# Patient Record
Sex: Male | Born: 2000 | Race: Black or African American | Hispanic: No | Marital: Single
Health system: Southern US, Community
[De-identification: ages and names within clinical notes are randomized; demographics above are authoritative.]

---

## 2018-12-05 ENCOUNTER — Encounter (HOSPITAL_COMMUNITY): Payer: Self-pay

## 2018-12-05 ENCOUNTER — Emergency Department (HOSPITAL_COMMUNITY): Payer: BLUE CROSS/BLUE SHIELD

## 2018-12-05 ENCOUNTER — Other Ambulatory Visit: Payer: Self-pay

## 2018-12-05 ENCOUNTER — Emergency Department (HOSPITAL_COMMUNITY)
Admission: EM | Admit: 2018-12-05 | Discharge: 2018-12-05 | Disposition: A | Discharge status: Home / Self Care | Payer: BLUE CROSS/BLUE SHIELD | Attending: Emergency Medicine | Admitting: Emergency Medicine

## 2018-12-05 DIAGNOSIS — S62356A Nondisplaced fracture of shaft of fifth metacarpal bone, right hand, initial encounter for closed fracture: Secondary | ICD-10-CM | POA: Diagnosis not present

## 2018-12-05 DIAGNOSIS — Y998 Other external cause status: Secondary | ICD-10-CM | POA: Insufficient documentation

## 2018-12-05 DIAGNOSIS — W231XXA Caught, crushed, jammed, or pinched between stationary objects, initial encounter: Secondary | ICD-10-CM | POA: Insufficient documentation

## 2018-12-05 DIAGNOSIS — S6991XA Unspecified injury of right wrist, hand and finger(s), initial encounter: Secondary | ICD-10-CM | POA: Diagnosis present

## 2018-12-05 DIAGNOSIS — Y9389 Activity, other specified: Secondary | ICD-10-CM | POA: Diagnosis not present

## 2018-12-05 DIAGNOSIS — Y92810 Car as the place of occurrence of the external cause: Secondary | ICD-10-CM | POA: Diagnosis not present

## 2018-12-05 MED ORDER — HYDROCODONE-ACETAMINOPHEN 5-325 MG PO TABS: 1.0000 | ORAL_TABLET | Freq: Four times a day (QID) | ORAL | 0 refills | Status: AC | PRN

## 2018-12-05 MED ORDER — OXYCODONE-ACETAMINOPHEN 5-325 MG PO TABS
1.0000 | ORAL_TABLET | Freq: Once | ORAL | Status: AC
  Administered 2018-12-05: 1 via ORAL
  Filled 2018-12-05: qty 1

## 2018-12-05 NOTE — ED Triage Notes (Signed)
Pt presents with c/o R hand swelling and pain (6/10), after accidentally closing hand in car door. Pt states that he was moving too fast.

## 2018-12-05 NOTE — ED Provider Notes (Signed)
[facility] EMERGENCY DEPARTMENT Provider Note   CSN: [id number] Arrival date & time: 12/05/18  1823     History   Chief Complaint Chief Complaint  Patient presents with  . Hand Injury    R    HPI Samuel Glover is a 18 y.o. male presenting for evaluation of right hand injury.  Patient states just prior to arrival, he accidentally slammed the car door on his right hand.  He reports acute onset pain.  He denies numbness or tingling.  He denies injury elsewhere.  He reports only be ulnar aspect of his hand got caught in a car door.  He is right-handed.  He has no medical problems, takes medications daily.  Pain is constant, worse with squeezing his fist.  He has not taken anything for pain including Tylenol ibuprofen.  HPI  History reviewed. No pertinent past medical history.  There are no active problems to display for this patient.    Home Medications    Prior to Admission medications   Not on File    Family History History reviewed. No pertinent family history.  Social History Social History   Tobacco Use  . Smoking status: Not on file  Substance Use Topics  . Alcohol use: Not on file  . Drug use: Not on file     Allergies   Patient has no known allergies.   Review of Systems Review of Systems  Musculoskeletal: Positive for arthralgias.  Neurological: Negative for numbness.     Physical Exam Updated Vital Signs BP (!) 146/101 (BP Location: Right Arm)   Pulse (!) 111   Temp 99.8 F (37.7 C)   Resp 18   Ht 5\' 9"  (1.753 m)   Wt 108.9 kg   SpO2 100%   BMI 35.44 kg/m   Physical Exam Vitals signs and nursing note reviewed.  Constitutional:      General: He is not in acute distress.    Appearance: He is well-developed.  HENT:     Head: Normocephalic and atraumatic.  Neck:     Musculoskeletal: Normal range of motion.  Pulmonary:     Effort: Pulmonary effort is normal.  Abdominal:     General: There is no distension.    Musculoskeletal:        General: Swelling and tenderness present.     Comments: Swelling and tenderness to the fifth metacarpal of the right hand.  No injury noted elsewhere in the hand.  No lacerations.  No tenderness palpation of the finger or wrist.  Full active range of motion of the wrist without pain.  Sensation of distal finger intact.  Good cap refill.  Skin:    General: Skin is warm.     Capillary Refill: Capillary refill takes less than 2 seconds.     Findings: No rash.  Neurological:     Mental Status: He is alert and oriented to person, place, and time.      ED Treatments / Results  Labs (all labs ordered are listed, but only abnormal results are displayed) Labs Reviewed - No data to display  EKG None  Radiology Dg Hand Complete Right  Result Date: 12/05/2018 CLINICAL DATA:  Shut car door on hand EXAM: RIGHT HAND - COMPLETE 3+ VIEW COMPARISON:  None. FINDINGS: Acute fracture midshaft right fifth metacarpal with mild volar angulation of distal fracture fragment. No subluxation. IMPRESSION: Acute mildly angulated fracture midshaft of the fifth metacarpal. Electronically Signed   By: Adrian Prows.D.  On: 12/05/2018 19:03    Procedures  .Splint Application Date/Time: 12/05/2018 8:58 PM Performed by: Milana Huntsman, NT Authorized by: Alveria Apley, PA-C   Consent:    Consent obtained:  Verbal   Consent given by:  Patient   Risks discussed:  Discoloration, numbness, pain and swelling Pre-procedure details:    Sensation:  Normal Procedure details:    Laterality:  Right   Location:  Hand   Hand:  R hand   Strapping: no     Cast type:  Short arm   Splint type:  Ulnar gutter Post-procedure details:    Pain:  Unchanged   Sensation:  Normal   Skin color:  Pink, warm, dry   Patient tolerance of procedure:  Tolerated well, no immediate complications   (including critical care time)  Medications Ordered in ED Medications  oxyCODONE-acetaminophen  (PERCOCET/ROXICET) 5-325 MG per tablet 1 tablet (1 tablet Oral Given 12/05/18 2050)     Initial Impression / Assessment and Plan / ED Course  I have reviewed the triage vital signs and the nursing notes.  Pertinent labs & imaging results that were available during my care of the patient were reviewed by me and considered in my medical decision making (see chart for details).     Presenting for evaluation of hand pain after getting caught in a car door.  Physical exam reassuring, he is neurovascularly intact.  X-rays read interpreted by me, shows a midshaft fracture with mild angulation.  Discussed findings with patient.  Will place an ulnar gutter splint, and have follow-up with hand as needed. Pt is tachycardic on arrival, likely 2/2 pain. Improved with pain control.   On reassessment, pt remains neurovascularly intact after splint placement. HR is mildly elevated, but patient also has low-grade fever at 99.8.  Patient denying uri sxs. Denies hand pain currently. Discussed symptomatic control at home with pain control, ice, and elevation. PMP checked, pt without recent controlled substance rx. Follow-up with hand.  At this time, patient appears safe for discharge.  Return precautions given.  Patient states he understands and agrees plan.  Final Clinical Impressions(s) / ED Diagnoses   Final diagnoses:  Closed nondisplaced fracture of shaft of fifth metacarpal bone of right hand, initial encounter    ED Discharge Orders         Ordered    HYDROcodone-acetaminophen (NORCO/VICODIN) 5-325 MG tablet  Every 6 hours PRN     12/05/18 2123           Alveria Apley, PA-C 12/05/18 2305    Vanetta Mulders, MD 12/07/18 1544

## 2018-12-05 NOTE — ED Notes (Signed)
Patient verbalizes understanding of discharge instructions. Opportunity for questioning and answers were provided. Armband removed by staff, pt discharged from ED.  

## 2018-12-05 NOTE — ED Notes (Signed)
Ortho tech at bedside 

## 2018-12-05 NOTE — ED Notes (Signed)
Ortho contacted for splint. 20 minute

## 2018-12-05 NOTE — Discharge Instructions (Signed)
Use Tylenol or ibuprofen as needed for mild to moderate pain.  Use Norco as needed for severe breakthrough pain.  This is a narcotic pain medicine, have caution to make you tired or groggy.  Do not drive or operate heavy machinery while taking this medicine. Keep your hand elevated when able to help decrease swelling. Call the hand doctor on Monday for further evaluation and management of your hand. Return to the emergency room if you develop numbness, severe worsening pain, or any new, worsening, concerning symptoms.

## 2018-12-05 NOTE — Progress Notes (Signed)
Orthopedic Tech Progress Note Patient Details:  Antonieta IbaJohn Hodgens 04/25/2001 [id number]  Ortho Devices Type of Ortho Device: Ulna gutter splint Ortho Device/Splint Interventions: Adjustment, Application, Ordered   Post Interventions Patient Tolerated: Well Instructions Provided: Adjustment of device, Care of device   Norva KarvonenWalls, Leon Goodnow T 12/05/2018, 9:09 PM

## 2020-09-07 IMAGING — CR DG HAND COMPLETE 3+V*R*
3 series · 3 of 3 positions shown · non-contrast
Comparison: None.

CLINICAL DATA: Shut car door on hand

EXAM:
RIGHT HAND - COMPLETE 3+ VIEW

[hand pa]
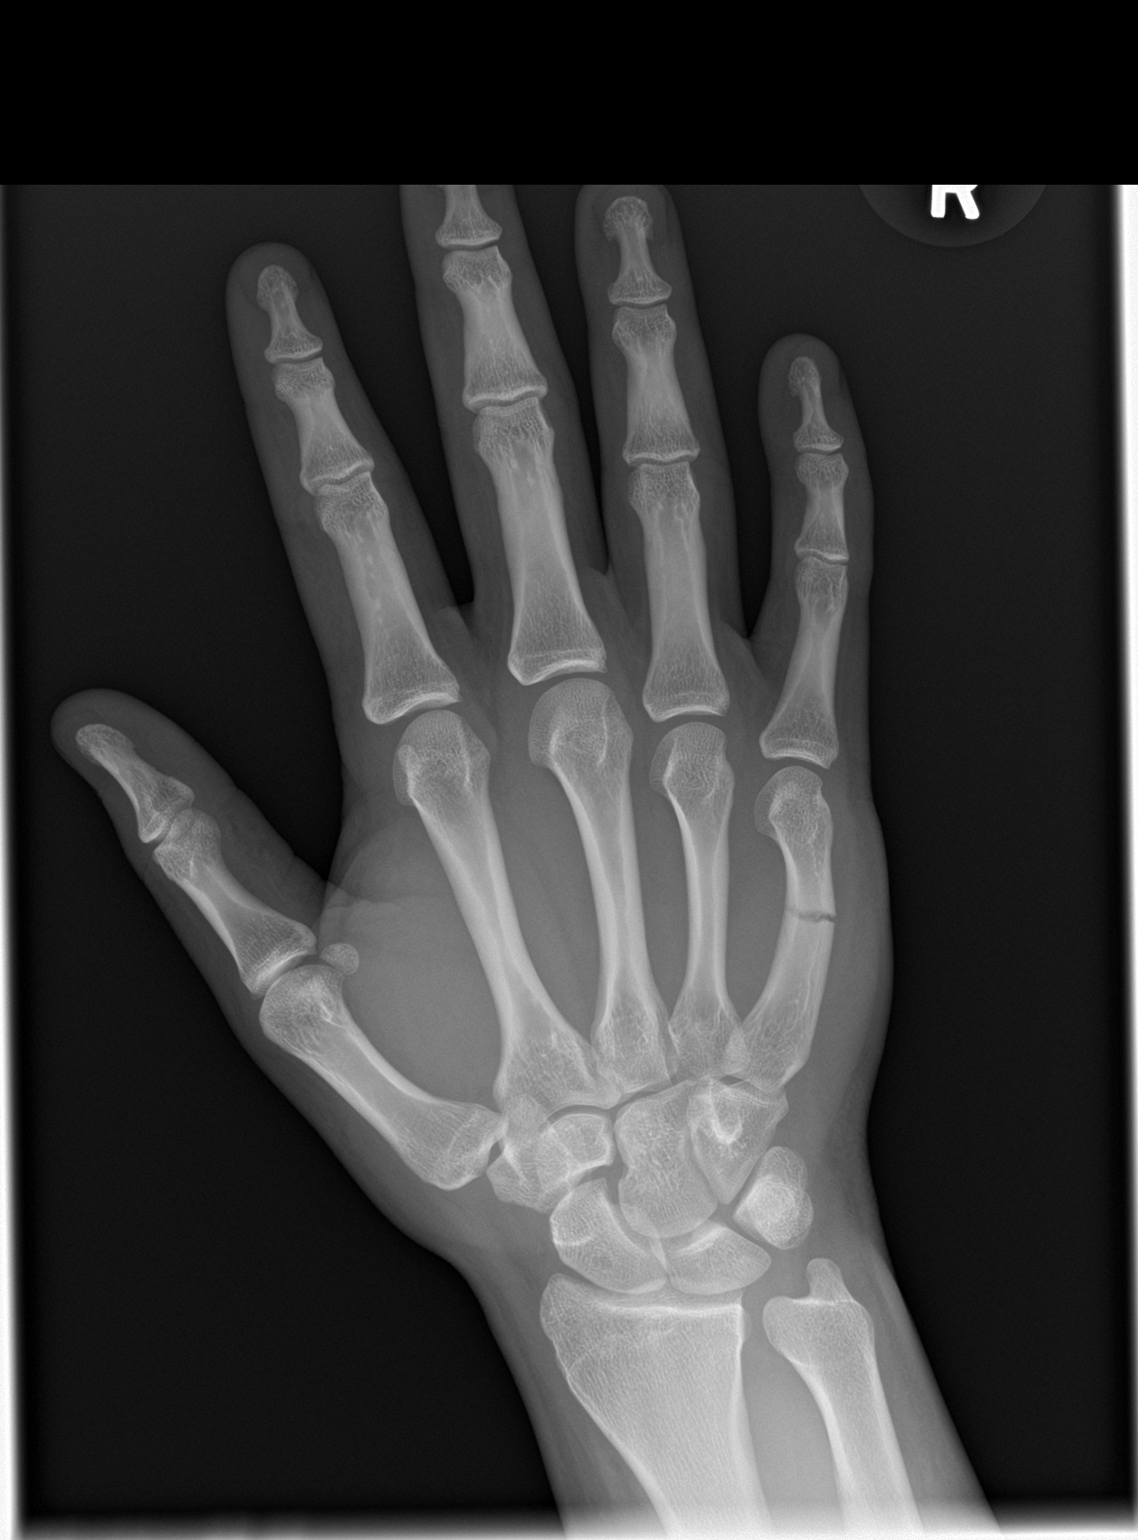

[hand obl]
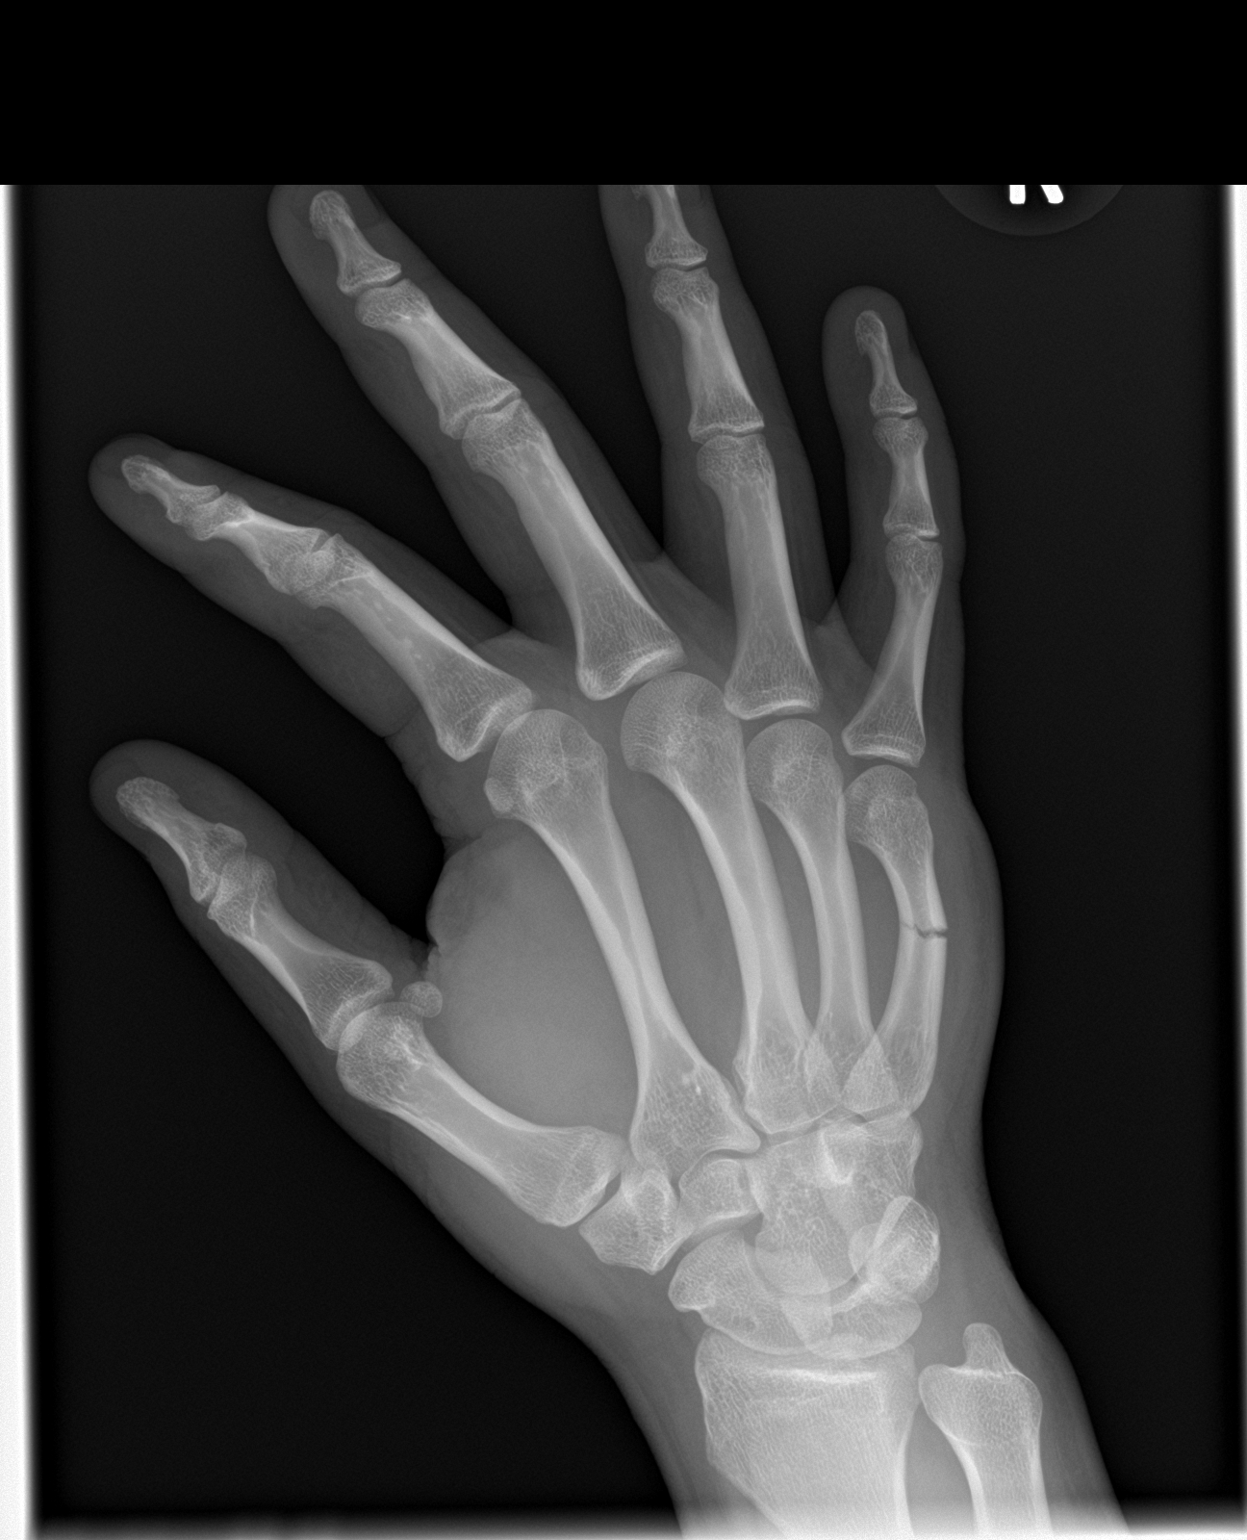

[hand lat]
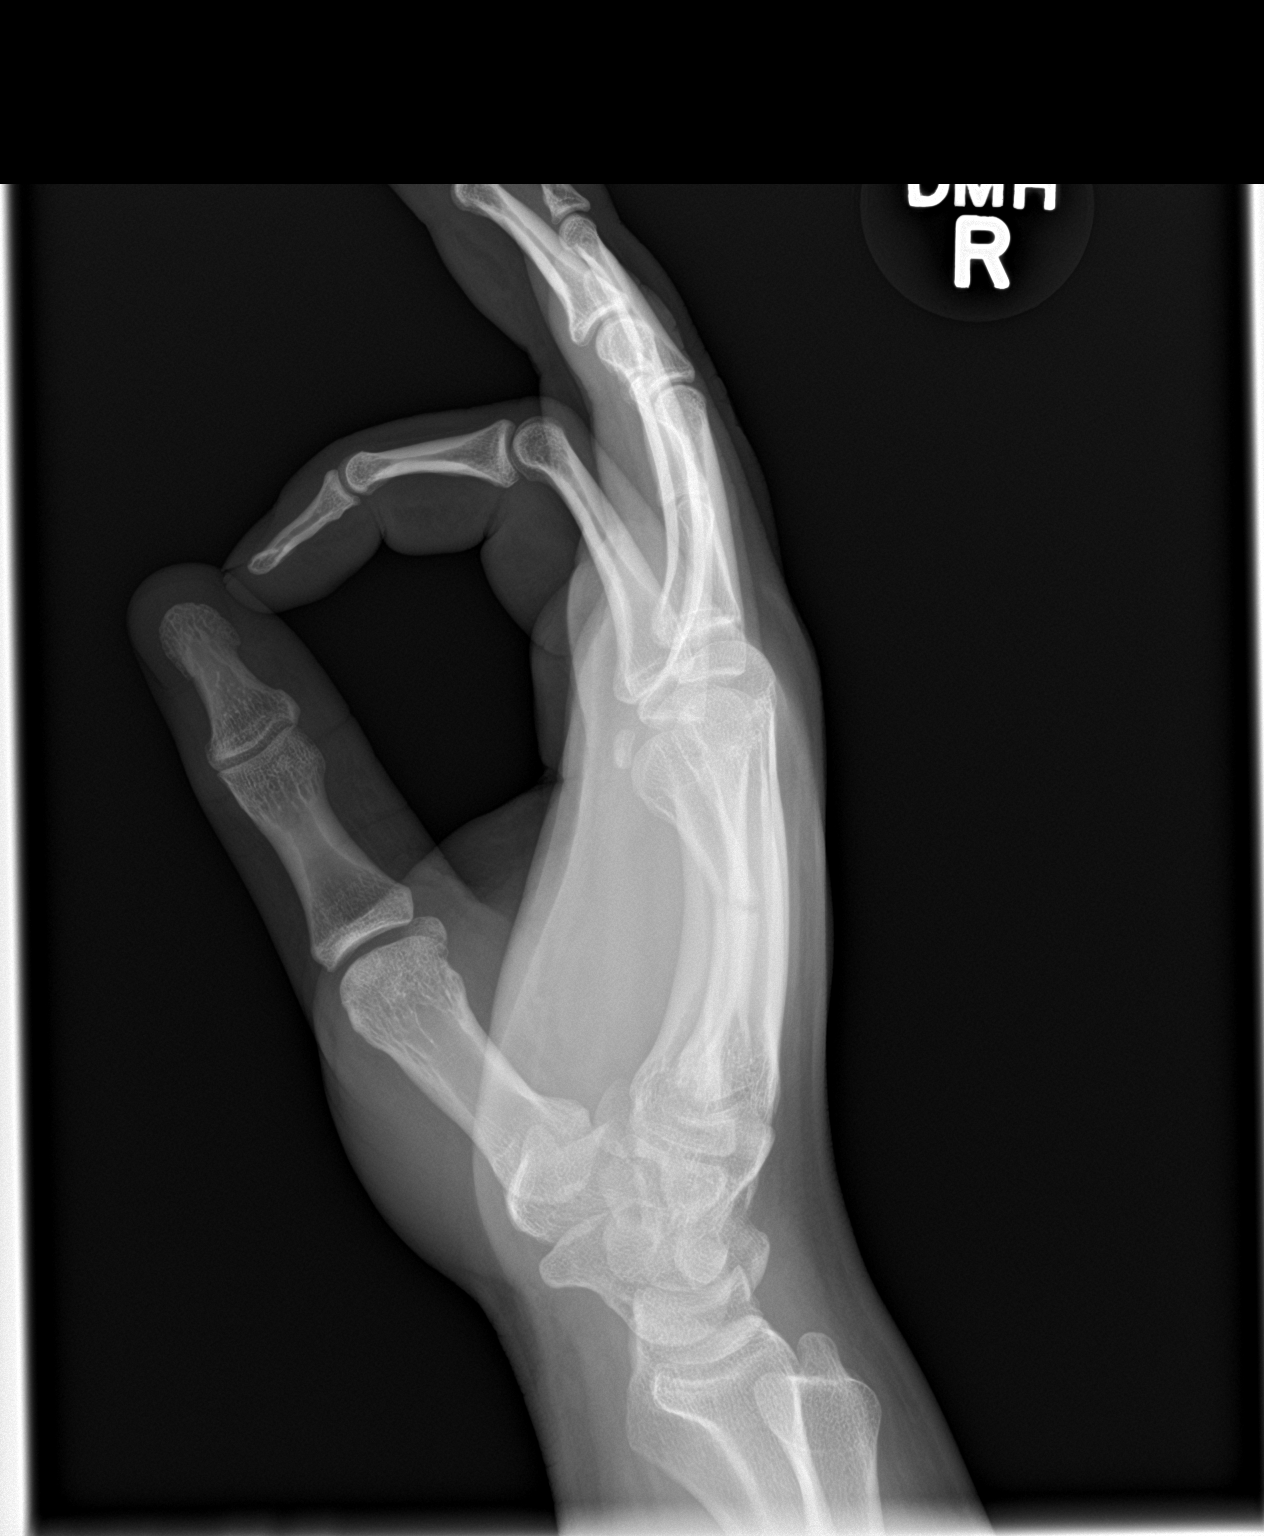

[3 of 3 positions shown; findings below may reference images not displayed]

FINDINGS: Acute fracture midshaft right fifth metacarpal with mild volar
angulation of distal fracture fragment. No subluxation.
IMPRESSION: Acute mildly angulated fracture midshaft of the fifth metacarpal.
